# Patient Record
Sex: Male | Born: 1995 | Race: Black or African American | Hispanic: No | Marital: Single | Smoking: Current every day smoker
Health system: Southern US, Community
[De-identification: ages and names within clinical notes are randomized; demographics above are authoritative.]

---
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
## 2015-02-19 ENCOUNTER — Encounter (HOSPITAL_COMMUNITY): Payer: Self-pay | Admitting: Emergency Medicine

## 2015-02-19 ENCOUNTER — Emergency Department (HOSPITAL_COMMUNITY)
Admission: EM | Admit: 2015-02-19 | Discharge: 2015-02-19 | Disposition: A | Discharge status: Home / Self Care | Payer: Self-pay | Attending: Emergency Medicine | Admitting: Emergency Medicine

## 2015-02-19 DIAGNOSIS — X58XXXS Exposure to other specified factors, sequela: Secondary | ICD-10-CM | POA: Insufficient documentation

## 2015-02-19 DIAGNOSIS — R519 Headache, unspecified: Secondary | ICD-10-CM

## 2015-02-19 DIAGNOSIS — R300 Dysuria: Secondary | ICD-10-CM | POA: Insufficient documentation

## 2015-02-19 DIAGNOSIS — S61302S Unspecified open wound of right middle finger with damage to nail, sequela: Secondary | ICD-10-CM | POA: Insufficient documentation

## 2015-02-19 DIAGNOSIS — S61309S Unspecified open wound of unspecified finger with damage to nail, sequela: Secondary | ICD-10-CM

## 2015-02-19 DIAGNOSIS — R51 Headache: Secondary | ICD-10-CM | POA: Insufficient documentation

## 2015-02-19 DIAGNOSIS — A64 Unspecified sexually transmitted disease: Secondary | ICD-10-CM | POA: Insufficient documentation

## 2015-02-19 LAB — URINALYSIS, ROUTINE W REFLEX MICROSCOPIC
Glucose, UA: NEGATIVE mg/dL
HGB URINE DIPSTICK: NEGATIVE
Ketones, ur: 15 mg/dL — AB
NITRITE: NEGATIVE
Protein, ur: NEGATIVE mg/dL
Specific Gravity, Urine: 1.03 (ref 1.005–1.030)
UROBILINOGEN UA: 1 mg/dL (ref 0.0–1.0)
pH: 5.5 (ref 5.0–8.0)

## 2015-02-19 LAB — URINE MICROSCOPIC-ADD ON

## 2015-02-19 MED ORDER — CEFTRIAXONE SODIUM 250 MG IJ SOLR
250.0000 mg | Freq: Once | INTRAMUSCULAR | Status: AC
  Administered 2015-02-19: 250 mg via INTRAMUSCULAR
  Filled 2015-02-19: qty 250

## 2015-02-19 MED ORDER — AZITHROMYCIN 250 MG PO TABS
1000.0000 mg | ORAL_TABLET | Freq: Once | ORAL | Status: AC
  Administered 2015-02-19: 1000 mg via ORAL
  Filled 2015-02-19: qty 4

## 2015-02-19 MED ORDER — ACETAMINOPHEN 325 MG PO TABS
650.0000 mg | ORAL_TABLET | Freq: Once | ORAL | Status: AC
  Administered 2015-02-19: 650 mg via ORAL
  Filled 2015-02-19: qty 2

## 2015-02-19 NOTE — ED Notes (Signed)
Pt verbalizes understanding of d/c instructions and denies any further needs at this time. 

## 2015-02-19 NOTE — ED Notes (Signed)
Pt. reports penile discharge onset today , denies hematuria or dysuria , pt. added right middle finger nail deformity and mild headache.

## 2015-02-19 NOTE — Discharge Instructions (Signed)
Return to the emergency room with worsening of symptoms, new symptoms or with symptoms that are concerning  °Abstain from intercourse until you have finished your antibiotic, you have no symptoms and partners have been treated. °Read below information and follow recommendations. ° °Safe Sex °Safe sex is about reducing the risk of giving or getting a sexually transmitted disease (STD). STDs are spread through sexual contact involving the genitals, mouth, or rectum. Some STDs can be cured and others cannot. Safe sex can also prevent unintended pregnancies.  °WHAT ARE SOME SAFE SEX PRACTICES? °· Limit your sexual activity to only one partner who is having sex with only you. °· Talk to your partner about his or her past partners, past STDs, and drug use. °· Use a condom every time you have sexual intercourse. This includes vaginal, oral, and anal sexual activity. Both females and males should wear condoms during oral sex. Only use latex or polyurethane condoms and water-based lubricants. Using petroleum-based lubricants or oils to lubricate a condom will weaken the condom and increase the chance that it will break. The condom should be in place from the beginning to the end of sexual activity. Wearing a condom reduces, but does not completely eliminate, your risk of getting or giving an STD. STDs can be spread by contact with infected body fluids and skin. °· Get vaccinated for hepatitis B and HPV. °· Avoid alcohol and recreational drugs, which can affect your judgment. You may forget to use a condom or participate in high-risk sex. °· For females, avoid douching after sexual intercourse. Douching can spread an infection farther into the reproductive tract. °· Check your body for signs of sores, blisters, rashes, or unusual discharge. See your health care provider if you notice any of these signs. °· Avoid sexual contact if you have symptoms of an infection or are being treated for an STD. If you or your partner has  herpes, avoid sexual contact when blisters are present. Use condoms at all other times. °· If you are at risk of being infected with HIV, it is recommended that you take a prescription medicine daily to prevent HIV infection. This is called pre-exposure prophylaxis (PrEP). You are considered at risk if: °¨ You are a man who has sex with other men (MSM). °¨ You are a heterosexual man or woman who is sexually active with more than one partner. °¨ You take drugs by injection. °¨ You are sexually active with a partner who has HIV. °· Talk with your health care provider about whether you are at high risk of being infected with HIV. If you choose to begin PrEP, you should first be tested for HIV. You should then be tested every 3 months for as long as you are taking PrEP. °· See your health care provider for regular screenings, exams, and tests for other STDs. Before having sex with a new partner, each of you should be screened for STDs and should talk about the results with each other. °WHAT ARE THE BENEFITS OF SAFE SEX?  °· There is less chance of getting or giving an STD. °· You can prevent unwanted or unintended pregnancies. °· By discussing safe sex concerns with your partner, you may increase feelings of intimacy, comfort, trust, and honesty between the two of you. °Document Released: 09/13/2004 Document Revised: 12/21/2013 Document Reviewed: 01/28/2012 °ExitCare® Patient Information ©2015 [employer]. This information is not intended to replace advice given to you by your health care provider. Make sure you discuss any questions   you have with your health care provider. ° °Sexually Transmitted Disease °A sexually transmitted disease (STD) is a disease or infection that may be passed (transmitted) from person to person, usually during sexual activity. This may happen by way of saliva, semen, blood, vaginal mucus, or urine. Common STDs include:  °· Gonorrhea.   °· Chlamydia.   °· Syphilis.   °· HIV and AIDS.    °· Genital herpes.   °· Hepatitis B and C.   °· Trichomonas.   °· Human papillomavirus (HPV).   °· Pubic lice.   °· Scabies. °· Mites. °· Bacterial vaginosis. °WHAT ARE CAUSES OF STDs? °An STD may be caused by bacteria, a virus, or parasites. STDs are often transmitted during sexual activity if one person is infected. However, they may also be transmitted through nonsexual means. STDs may be transmitted after:  °· Sexual intercourse with an infected person.   °· Sharing sex toys with an infected person.   °· Sharing needles with an infected person or using unclean piercing or tattoo needles. °· Having intimate contact with the genitals, mouth, or rectal areas of an infected person.   °· Exposure to infected fluids during birth. °WHAT ARE THE SIGNS AND SYMPTOMS OF STDs? °Different STDs have different symptoms. Some people may not have any symptoms. If symptoms are present, they may include:  °· Painful or bloody urination.   °· Pain in the pelvis, abdomen, vagina, anus, throat, or eyes.   °· A skin rash, itching, or irritation. °· Growths, ulcerations, blisters, or sores in the genital and anal areas. °· Abnormal vaginal discharge with or without bad odor.   °· Penile discharge in men.   °· Fever.   °· Pain or bleeding during sexual intercourse.   °· Swollen glands in the groin area.   °· Yellow skin and eyes (jaundice). This is seen with hepatitis.   °· Swollen testicles. °· Infertility. °· Sores and blisters in the mouth. °HOW ARE STDs DIAGNOSED? °To make a diagnosis, your health care provider may:  °· Take a medical history.   °· Perform a physical exam.   °· Take a sample of any discharge to examine. °· Swab the throat, cervix, opening to the penis, rectum, or vagina for testing. °· Test a sample of your first morning urine.   °· Perform blood tests.   °· Perform a Pap test, if this applies.   °· Perform a colposcopy.   °· Perform a laparoscopy.   °HOW ARE STDs TREATED? ° Treatment depends on the STD. Some STDs  may be treated but not cured.  °· Chlamydia, gonorrhea, trichomonas, and syphilis can be cured with antibiotic medicine.   °· Genital herpes, hepatitis, and HIV can be treated, but not cured, with prescribed medicines. The medicines lessen symptoms.   °· Genital warts from HPV can be treated with medicine or by freezing, burning (electrocautery), or surgery. Warts may come back.   °· HPV cannot be cured with medicine or surgery. However, abnormal areas may be removed from the cervix, vagina, or vulva.   °· If your diagnosis is confirmed, your recent sexual partners need treatment. This is true even if they are symptom-free or have a negative culture or evaluation. They should not have sex until their health care providers say it is okay. °HOW CAN I REDUCE MY RISK OF GETTING AN STD? °Take these steps to reduce your risk of getting an STD: °· Use latex condoms, dental dams, and water-soluble lubricants during sexual activity. Do not use petroleum jelly or oils. °· Avoid having multiple sex partners. °· Do not have sex with someone who has other sex partners. °· Do not have sex with   anyone you do not know or who is at high risk for an STD. °· Avoid risky sex practices that can break your skin. °· Do not have sex if you have open sores on your mouth or skin. °· Avoid drinking too much alcohol or taking illegal drugs. Alcohol and drugs can affect your judgment and put you in a vulnerable position. °· Avoid engaging in oral and anal sex acts. °· Get vaccinated for HPV and hepatitis. If you have not received these vaccines in the past, talk to your health care provider about whether one or both might be right for you.   °· If you are at risk of being infected with HIV, it is recommended that you take a prescription medicine daily to prevent HIV infection. This is called pre-exposure prophylaxis (PrEP). You are considered at risk if: °¨ You are a man who has sex with other men (MSM). °¨ You are a heterosexual man or woman  and are sexually active with more than one partner. °¨ You take drugs by injection. °¨ You are sexually active with a partner who has HIV. °· Talk with your health care provider about whether you are at high risk of being infected with HIV. If you choose to begin PrEP, you should first be tested for HIV. You should then be tested every 3 months for as long as you are taking PrEP.   °WHAT SHOULD I DO IF I THINK I HAVE AN STD? °· See your health care provider.   °· Tell your sexual partner(s). They should be tested and treated for any STDs. °· Do not have sex until your health care provider says it is okay.  °WHEN SHOULD I GET IMMEDIATE MEDICAL CARE? °Contact your health care provider right away if:  °· You have severe abdominal pain. °· You are a man and notice swelling or pain in your testicles. °· You are a woman and notice swelling or pain in your vagina. °Document Released: 10/27/2002 Document Revised: 08/11/2013 Document Reviewed: 02/24/2013 °ExitCare® Patient Information ©2015 [employer]. This information is not intended to replace advice given to you by your health care provider. Make sure you discuss any questions you have with your health care provider. ° °

## 2015-02-19 NOTE — ED Provider Notes (Signed)
CSN: [id number]     Arrival date & time 02/19/15  1922 History  This chart was scribed for non-physician practitioner, Oswaldo Conroy, PA-C, working with Rolland Porter, MD, by Ronney Lion, ED Scribe. This patient was seen in room TR07C/TR07C and the patient's care was started at 8:50 PM.    Chief Complaint  Patient presents with  . Penile Discharge  . Finger Injury   The history is provided by the patient. No language interpreter was used.   HPI Comments: Bobby Byrd is a 19 y.o. male who presents to the Emergency Department complaining of constant green-ish penile discharge that he first noticed today. He endorses associated dysuria and reports cloudy urine. He states he has had unprotected sex and has concerns of STD's. He denies a history of any medical conditions. He also denies penile or testicular swelling, penile or testicular redness, hematuria, abdominal pain, fevers, or chills.   He also complains of a finger injury that occurred 1 month ago. He denies any pain or subsequent injury. He denies numbness, tingling, weakness, fever, chills, or swelling.     History reviewed. No pertinent past medical history. History reviewed. No pertinent past surgical history. No family history on file. History  Substance Use Topics  . Smoking status: Passive Smoke Exposure - Never Smoker  . Smokeless tobacco: Not on file  . Alcohol Use: No    Review of Systems  Constitutional: Negative for fever and chills.  Gastrointestinal: Negative for abdominal pain.  Genitourinary: Positive for dysuria and discharge. Negative for hematuria, penile swelling and scrotal swelling.  Neurological: Negative for weakness and numbness.    Allergies  Review of patient's allergies indicates no known allergies.  Home Medications   Prior to Admission medications   Medication Sig Start Date End Date Taking? Authorizing Provider  bismuth subsalicylate (PEPTO BISMOL) 262 MG/15ML suspension Take 30 mLs by  mouth every 6 (six) hours as needed.    Historical Provider, MD  ondansetron (ZOFRAN ODT) 4 MG disintegrating tablet  ODT q4 hours prn nausea/vomit 08/12/14   Mirian Mo, MD   BP 119/53 mmHg  Pulse 79  Temp(Src) 98.9 F (37.2 C) (Oral)  Resp 18  Wt 254 lb 9 oz (115.469 kg)  SpO2 100% Physical Exam  Constitutional: He appears well-developed and well-nourished. No distress.  HENT:  Head: Normocephalic and atraumatic.  Eyes: Conjunctivae and EOM are normal. Right eye exhibits no discharge. Left eye exhibits no discharge.  Cardiovascular: Normal rate and regular rhythm.   Pulses:      Radial pulses are 2+ on the right side, and 2+ on the left side.  Pulmonary/Chest: Effort normal and breath sounds normal. No respiratory distress. He has no wheezes.  Abdominal: Soft. Bowel sounds are normal. He exhibits no distension. There is no tenderness.  Genitourinary:  Normal appearing uncircumcised penis without erythema, lesions, swelling. Opaque yellow penile discharge. Scrotum without edema, erythema or lesions. Normal testicular lie. No tenderness to palpation. No inguinal hernias. Scribe as Biomedical engineer.  Musculoskeletal:  Partial nail avulsion to right middle finger. 5/5 strength. Sensaion intact. 2+ radial pulses. FROM.   Neurological: He is alert. He exhibits normal muscle tone. Coordination normal.  Speech fluent and goal oriented. Patient moves all four extremities without any focal neurological deficits and has no facial droop. Normal gait.  Skin: Skin is warm and dry. He is not diaphoretic.  Nursing note and vitals reviewed.   ED Course  Procedures (including critical care time)  DIAGNOSTIC STUDIES: Oxygen Saturation is  98% on RA, normal by my interpretation.    COORDINATION OF CARE: 8:52 PM - Discussed treatment plan with pt at bedside which includes prophylactic treatment for gonorrhea and chlamydia. STD test results available by phone. Advised abstention from sexual activity  for a period of time until partners treated and completed all antibiotics. Discussed safe sex. Pt with remote nail avulsion. No infection. Follow up as needed.  Labs Review Labs Reviewed  URINALYSIS, ROUTINE W REFLEX MICROSCOPIC (NOT AT [facility]) - Abnormal; Notable for the following:    Color, Urine AMBER (*)    APPearance CLOUDY (*)    Bilirubin Urine SMALL (*)    Ketones, ur 15 (*)    Leukocytes, UA LARGE (*)    All other components within normal limits  URINE MICROSCOPIC-ADD ON - Abnormal; Notable for the following:    Bacteria, UA FEW (*)    All other components within normal limits  RPR  HIV ANTIBODY (ROUTINE TESTING)  GC/CHLAMYDIA PROBE AMP ([place]) NOT [facility]    Meds given in ED:  Medications  cefTRIAXone (ROCEPHIN) injection 250 mg (250 mg Intramuscular Given 02/19/15 2124)  azithromycin (ZITHROMAX) tablet 1,000 mg (1,000 mg Oral Given 02/19/15 2123)  acetaminophen (TYLENOL) tablet 650 mg (650 mg Oral Given 02/19/15 2123)    Discharge Medication List as of 02/19/2015  9:37 PM        MDM   Final diagnoses:  STD (male)  Nonintractable headache  Nail avulsion, finger, sequela   It has been determined that no acute conditions requiring further emergency intervention are present at this time. The patient/guardian have been advised of the diagnosis and plan. We have discussed signs and symptoms that warrant return to the ED, such as changes or worsening in symptoms.  I personally performed the services described in this documentation, which was scribed in my presence. The recorded information has been reviewed and is accurate.    Oswaldo ConroyVictoria Edson Deridder, PA-C 02/20/15 2237  Rolland PorterMark James, MD 02/24/15 [phone]

## 2015-02-20 LAB — RPR: RPR Ser Ql: NONREACTIVE

## 2015-02-20 LAB — HIV ANTIBODY (ROUTINE TESTING W REFLEX): HIV Screen 4th Generation wRfx: NONREACTIVE

## 2015-02-22 LAB — GC/CHLAMYDIA PROBE AMP (~~LOC~~) NOT AT ARMC
Chlamydia: NEGATIVE
Neisseria Gonorrhea: POSITIVE — AB

## 2015-02-23 ENCOUNTER — Telehealth (HOSPITAL_BASED_OUTPATIENT_CLINIC_OR_DEPARTMENT_OTHER): Payer: Self-pay | Admitting: Emergency Medicine

## 2015-05-22 ENCOUNTER — Encounter (HOSPITAL_COMMUNITY): Payer: Self-pay | Admitting: Emergency Medicine

## 2015-05-22 ENCOUNTER — Emergency Department (HOSPITAL_COMMUNITY)
Admission: EM | Admit: 2015-05-22 | Discharge: 2015-05-22 | Disposition: A | Discharge status: Home / Self Care | Payer: Self-pay | Attending: Emergency Medicine | Admitting: Emergency Medicine

## 2015-05-22 DIAGNOSIS — R300 Dysuria: Secondary | ICD-10-CM | POA: Insufficient documentation

## 2015-05-22 DIAGNOSIS — Z202 Contact with and (suspected) exposure to infections with a predominantly sexual mode of transmission: Secondary | ICD-10-CM | POA: Insufficient documentation

## 2015-05-22 DIAGNOSIS — M25561 Pain in right knee: Secondary | ICD-10-CM | POA: Insufficient documentation

## 2015-05-22 DIAGNOSIS — R369 Urethral discharge, unspecified: Secondary | ICD-10-CM | POA: Insufficient documentation

## 2015-05-22 DIAGNOSIS — Z711 Person with feared health complaint in whom no diagnosis is made: Secondary | ICD-10-CM

## 2015-05-22 MED ORDER — NAPROXEN 250 MG PO TABS: 250.0000 mg | ORAL_TABLET | Freq: Two times a day (BID) | ORAL | Status: DC

## 2015-05-22 MED ORDER — LIDOCAINE HCL (PF) 1 % IJ SOLN
INTRAMUSCULAR | Status: AC
  Administered 2015-05-22: 2 mL
  Filled 2015-05-22: qty 5

## 2015-05-22 MED ORDER — PENICILLIN G BENZATHINE 1200000 UNIT/2ML IM SUSP
2.4000 10*6.[IU] | Freq: Once | INTRAMUSCULAR | Status: AC
  Administered 2015-05-22: 2.4 10*6.[IU] via INTRAMUSCULAR
  Filled 2015-05-22: qty 4

## 2015-05-22 MED ORDER — AZITHROMYCIN 250 MG PO TABS
1000.0000 mg | ORAL_TABLET | Freq: Once | ORAL | Status: AC
  Administered 2015-05-22: 1000 mg via ORAL
  Filled 2015-05-22: qty 4

## 2015-05-22 MED ORDER — CEFTRIAXONE SODIUM 250 MG IJ SOLR
250.0000 mg | Freq: Once | INTRAMUSCULAR | Status: AC
  Administered 2015-05-22: 250 mg via INTRAMUSCULAR
  Filled 2015-05-22: qty 250

## 2015-05-22 MED ORDER — LIDOCAINE HCL 2 % IJ SOLN
5.0000 mL | Freq: Once | INTRAMUSCULAR | Status: DC
  Filled 2015-05-22: qty 20

## 2015-05-22 NOTE — Discharge Instructions (Signed)
Please follow up next week at the health department to have repeat STD testing to ensure resolution of your STDs. Please notify all partners and their need for treatment and testing.  Sexually Transmitted Disease A sexually transmitted disease (STD) is a disease or infection that may be passed (transmitted) from person to person, usually during sexual activity. This may happen by way of saliva, semen, blood, vaginal mucus, or urine. Common STDs include:   Gonorrhea.   Chlamydia.   Syphilis.   HIV and AIDS.   Genital herpes.   Hepatitis B and C.   Trichomonas.   Human papillomavirus (HPV).   Pubic lice.   Scabies.  Mites.  Bacterial vaginosis. WHAT ARE CAUSES OF STDs? An STD may be caused by bacteria, a virus, or parasites. STDs are often transmitted during sexual activity if one person is infected. However, they may also be transmitted through nonsexual means. STDs may be transmitted after:   Sexual intercourse with an infected person.   Sharing sex toys with an infected person.   Sharing needles with an infected person or using unclean piercing or tattoo needles.  Having intimate contact with the genitals, mouth, or rectal areas of an infected person.   Exposure to infected fluids during birth. WHAT ARE THE SIGNS AND SYMPTOMS OF STDs? Different STDs have different symptoms. Some people may not have any symptoms. If symptoms are present, they may include:   Painful or bloody urination.   Pain in the pelvis, abdomen, vagina, anus, throat, or eyes.   A skin rash, itching, or irritation.  Growths, ulcerations, blisters, or sores in the genital and anal areas.  Abnormal vaginal discharge with or without bad odor.   Penile discharge in men.   Fever.   Pain or bleeding during sexual intercourse.   Swollen glands in the groin area.   Yellow skin and eyes (jaundice). This is seen with hepatitis.   Swollen testicles.  Infertility.  Sores  and blisters in the mouth. HOW ARE STDs DIAGNOSED? To make a diagnosis, your health care provider may:   Take a medical history.   Perform a physical exam.   Take a sample of any discharge to examine.  Swab the throat, cervix, opening to the penis, rectum, or vagina for testing.  Test a sample of your first morning urine.   Perform blood tests.   Perform a Pap test, if this applies.   Perform a colposcopy.   Perform a laparoscopy.  HOW ARE STDs TREATED? Treatment depends on the STD. Some STDs may be treated but not cured.   Chlamydia, gonorrhea, trichomonas, and syphilis can be cured with antibiotic medicine.   Genital herpes, hepatitis, and HIV can be treated, but not cured, with prescribed medicines. The medicines lessen symptoms.   Genital warts from HPV can be treated with medicine or by freezing, burning (electrocautery), or surgery. Warts may come back.   HPV cannot be cured with medicine or surgery. However, abnormal areas may be removed from the cervix, vagina, or vulva.   If your diagnosis is confirmed, your recent sexual partners need treatment. This is true even if they are symptom-free or have a negative culture or evaluation. They should not have sex until their health care providers say it is okay. HOW CAN I REDUCE MY RISK OF GETTING AN STD? Take these steps to reduce your risk of getting an STD:  Use latex condoms, dental dams, and water-soluble lubricants during sexual activity. Do not use petroleum jelly or oils.  Avoid having multiple sex partners.  Do not have sex with someone who has other sex partners.  Do not have sex with anyone you do not know or who is at high risk for an STD.  Avoid risky sex practices that can break your skin.  Do not have sex if you have open sores on your mouth or skin.  Avoid drinking too much alcohol or taking illegal drugs. Alcohol and drugs can affect your judgment and put you in a vulnerable  position.  Avoid engaging in oral and anal sex acts.  Get vaccinated for HPV and hepatitis. If you have not received these vaccines in the past, talk to your health care provider about whether one or both might be right for you.   If you are at risk of being infected with HIV, it is recommended that you take a prescription medicine daily to prevent HIV infection. This is called pre-exposure prophylaxis (PrEP). You are considered at risk if:  You are a man who has sex with other men (MSM).  You are a heterosexual man or woman and are sexually active with more than one partner.  You take drugs by injection.  You are sexually active with a partner who has HIV.  Talk with your health care provider about whether you are at high risk of being infected with HIV. If you choose to begin PrEP, you should first be tested for HIV. You should then be tested every 3 months for as long as you are taking PrEP.  WHAT SHOULD I DO IF I THINK I HAVE AN STD?  See your health care provider.   Tell your sexual partner(s). They should be tested and treated for any STDs.  Do not have sex until your health care provider says it is okay. WHEN SHOULD I GET IMMEDIATE MEDICAL CARE? Contact your health care provider right away if:   You have severe abdominal pain.  You are a man and notice swelling or pain in your testicles.  You are a woman and notice swelling or pain in your vagina. Document Released: 10/27/2002 Document Revised: 08/11/2013 Document Reviewed: 02/24/2013 [facility] Patient Information 2015 [place], [place]. This information is not intended to replace advice given to you by your health care provider. Make sure you discuss any questions you have with your health care provider. Knee Pain The knee is the complex joint between your thigh and your lower leg. It is made up of bones, tendons, ligaments, and cartilage. The bones that make up the knee are:  The femur in the thigh.  The tibia and  fibula in the lower leg.  The patella or kneecap riding in the groove on the lower femur. CAUSES  Knee pain is a common complaint with many causes. A few of these causes are:  Injury, such as:  A ruptured ligament or tendon injury.  Torn cartilage.  Medical conditions, such as:  Gout  Arthritis  Infections  Overuse, over training, or overdoing a physical activity. Knee pain can be minor or severe. Knee pain can accompany debilitating injury. Minor knee problems often respond well to self-care measures or get well on their own. More serious injuries may need medical intervention or even surgery. SYMPTOMS The knee is complex. Symptoms of knee problems can vary widely. Some of the problems are:  Pain with movement and weight bearing.  Swelling and tenderness.  Buckling of the knee.  Inability to straighten or extend your knee.  Your knee locks and you cannot straighten  it.  Warmth and redness with pain and fever.  Deformity or dislocation of the kneecap. DIAGNOSIS  Determining what is wrong may be very straight forward such as when there is an injury. It can also be challenging because of the complexity of the knee. Tests to make a diagnosis may include:  Your caregiver taking a history and doing a physical exam.  Routine X-rays can be used to rule out other problems. X-rays will not reveal a cartilage tear. Some injuries of the knee can be diagnosed by:  Arthroscopy a surgical technique by which a small video camera is inserted through tiny incisions on the sides of the knee. This procedure is used to examine and repair internal knee joint problems. Tiny instruments can be used during arthroscopy to repair the torn knee cartilage (meniscus).  Arthrography is a radiology technique. A contrast liquid is directly injected into the knee joint. Internal structures of the knee joint then become visible on X-ray film.  An MRI scan is a non X-ray radiology procedure in which  magnetic fields and a computer produce two- or three-dimensional images of the inside of the knee. Cartilage tears are often visible using an MRI scanner. MRI scans have largely replaced arthrography in diagnosing cartilage tears of the knee.  Blood work.  Examination of the fluid that helps to lubricate the knee joint (synovial fluid). This is done by taking a sample out using a needle and a syringe. TREATMENT The treatment of knee problems depends on the cause. Some of these treatments are:  Depending on the injury, proper casting, splinting, surgery, or physical therapy care will be needed.  Give yourself adequate recovery time. Do not overuse your joints. If you begin to get sore during workout routines, back off. Slow down or do fewer repetitions.  For repetitive activities such as cycling or running, maintain your strength and nutrition.  Alternate muscle groups. For example, if you are a weight lifter, work the upper body on one day and the lower body the next.  Either tight or weak muscles do not give the proper support for your knee. Tight or weak muscles do not absorb the stress placed on the knee joint. Keep the muscles surrounding the knee strong.  Take care of mechanical problems.  If you have flat feet, orthotics or special shoes may help. See your caregiver if you need help.  Arch supports, sometimes with wedges on the inner or outer aspect of the heel, can help. These can shift pressure away from the side of the knee most bothered by osteoarthritis.  A brace called an "unloader" brace also may be used to help ease the pressure on the most arthritic side of the knee.  If your caregiver has prescribed crutches, braces, wraps or ice, use as directed. The acronym for this is PRICE. This means protection, rest, ice, compression, and elevation.  Nonsteroidal anti-inflammatory drugs (NSAIDs), can help relieve pain. But if taken immediately after an injury, they may actually  increase swelling. Take NSAIDs with food in your stomach. Stop them if you develop stomach problems. Do not take these if you have a history of ulcers, stomach pain, or bleeding from the bowel. Do not take without your caregiver's approval if you have problems with fluid retention, heart failure, or kidney problems.  For ongoing knee problems, physical therapy may be helpful.  Glucosamine and chondroitin are over-the-counter dietary supplements. Both may help relieve the pain of osteoarthritis in the knee. These medicines are different from the  usual anti-inflammatory drugs. Glucosamine may decrease the rate of cartilage destruction.  Injections of a corticosteroid drug into your knee joint may help reduce the symptoms of an arthritis flare-up. They may provide pain relief that lasts a few months. You may have to wait a few months between injections. The injections do have a small increased risk of infection, water retention, and elevated blood sugar levels.  Hyaluronic acid injected into damaged joints may ease pain and provide lubrication. These injections may work by reducing inflammation. A series of shots may give relief for as long as 6 months.  Topical painkillers. Applying certain ointments to your skin may help relieve the pain and stiffness of osteoarthritis. Ask your pharmacist for suggestions. Many over the-counter products are approved for temporary relief of arthritis pain.  In some countries, doctors often prescribe topical NSAIDs for relief of chronic conditions such as arthritis and tendinitis. A review of treatment with NSAID creams found that they worked as well as oral medications but without the serious side effects. PREVENTION  Maintain a healthy weight. Extra pounds put more strain on your joints.  Get strong, stay limber. Weak muscles are a common cause of knee injuries. Stretching is important. Include flexibility exercises in your workouts.  Be smart about exercise. If  you have osteoarthritis, chronic knee pain or recurring injuries, you may need to change the way you exercise. This does not mean you have to stop being active. If your knees ache after jogging or playing basketball, consider switching to swimming, water aerobics, or other low-impact activities, at least for a few days a week. Sometimes limiting high-impact activities will provide relief.  Make sure your shoes fit well. Choose footwear that is right for your sport.  Protect your knees. Use the proper gear for knee-sensitive activities. Use kneepads when playing volleyball or laying carpet. Buckle your seat belt every time you drive. Most shattered kneecaps occur in car accidents.  Rest when you are tired. SEEK MEDICAL CARE IF:  You have knee pain that is continual and does not seem to be getting better.  SEEK IMMEDIATE MEDICAL CARE IF:  Your knee joint feels hot to the touch and you have a high fever. MAKE SURE YOU:   Understand these instructions.  Will watch your condition.  Will get help right away if you are not doing well or get worse. Document Released: 06/03/2007 Document Revised: 10/29/2011 Document Reviewed: 06/03/2007 [facility] Patient Information 2015 [place], [place]. This information is not intended to replace advice given to you by your health care provider. Make sure you discuss any questions you have with your health care provider.

## 2015-05-22 NOTE — ED Provider Notes (Signed)
CSN: [id number]     Arrival date & time 05/22/15  1021 History   First MD Initiated Contact with Patient 05/22/15 1103     Chief Complaint  Patient presents with  . Knee Pain  . Penile Discharge    Bobby Byrd is a 19 y.o. male with a history of multiple coronary and Chlamydia infection who presents to the emergency department complaining of penile discharge for the past 2 days. He also reports associated dysuria. Pupils is feels similar to last and he is diagnosed with gonorrhea and chlamydia. Patient reports he is having unprotected sex with one male partner. He reports this is the same partner he has had frequent STD infections from. Patient also complains of right knee pain ongoing for the past year. He denies current pain, but reports he has pain after heavy work out or after playing basketball for a long period of time. He denies any current pain. The patient denies fevers, chills, abdominal pain, nausea, vomiting, diarrhea, hematuria, hematochezia, penile lesions, penile rashes, testicular pain, penile pain, mouth sores or knee pain or leg swelling.   (Consider location/radiation/quality/duration/timing/severity/associated sxs/prior Treatment) HPI  History reviewed. No pertinent past medical history. History reviewed. No pertinent past surgical history. History reviewed. No pertinent family history. Social History  Substance Use Topics  . Smoking status: Passive Smoke Exposure - Never Smoker  . Smokeless tobacco: None  . Alcohol Use: No    Review of Systems  Constitutional: Negative for fever and chills.  HENT: Negative for mouth sores and sore throat.   Eyes: Negative for visual disturbance.  Respiratory: Negative for cough.   Cardiovascular: Negative for chest pain and leg swelling.  Gastrointestinal: Negative for nausea, vomiting, abdominal pain and diarrhea.  Genitourinary: Positive for dysuria and discharge. Negative for urgency, frequency, hematuria, flank  pain, decreased urine volume, penile swelling, scrotal swelling, difficulty urinating, genital sores, penile pain and testicular pain.  Musculoskeletal: Positive for arthralgias. Negative for back pain and neck pain.  Skin: Negative for rash.  Neurological: Negative for headaches.      Allergies  Review of patient's allergies indicates no known allergies.  Home Medications   Prior to Admission medications   Medication Sig Start Date End Date Taking? Authorizing Provider  bismuth subsalicylate (PEPTO BISMOL) 262 MG/15ML suspension Take 30 mLs by mouth every 6 (six) hours as needed.    Historical Provider, MD  naproxen (NAPROSYN) 250 MG tablet Take 1 tablet (250 mg total) by mouth 2 (two) times daily with a meal. 05/22/15   Everlene Farrier, PA-C  ondansetron (ZOFRAN ODT) 4 MG disintegrating tablet  ODT q4 hours prn nausea/vomit 08/12/14   Mirian Mo, MD   BP 122/68 mmHg  Pulse 64  Temp(Src) 98.2 F (36.8 C) (Oral)  Resp 20  Wt 251 lb 4 oz (113.966 kg)  SpO2 100% Physical Exam  Constitutional: He is oriented to person, place, and time. He appears well-developed and well-nourished. No distress.  Nontoxic appearing.  HENT:  Head: Normocephalic and atraumatic.  Mouth/Throat: Oropharynx is clear and moist. No oropharyngeal exudate.  Eyes: Conjunctivae are normal. Pupils are equal, round, and reactive to light. Right eye exhibits no discharge. Left eye exhibits no discharge.  Neck: Neck supple.  Cardiovascular: Normal rate, regular rhythm, normal heart sounds and intact distal pulses.   Bilateral radial and posterior tibialis pulses are intact.  Pulmonary/Chest: Effort normal and breath sounds normal. No respiratory distress. He has no wheezes. He has no rales.  Abdominal: Soft. Bowel sounds are  normal. He exhibits no distension. There is no tenderness. There is no guarding.  Abdomen is soft and nontender to palpation.  Musculoskeletal: Normal range of motion. He exhibits no edema  or tenderness.  Right knee is nontender to palpation. No right knee deformity, ecchymosis, edema or erythema. Good range of motion of his right knee. No right knee laxity noted. No calf edema or tenderness bilaterally. Patient is able to handle it with normal gait. Patient has 5/5 strength in his bilateral lower extremities.  Lymphadenopathy:    He has no cervical adenopathy.  Neurological: He is alert and oriented to person, place, and time. He has normal reflexes. He displays normal reflexes. Coordination normal.  Bilateral patellar DTRs are intact. Sensation is intact to his bilateral lower extremities. He is able to ambulate with normal gait.  Skin: Skin is warm and dry. No rash noted. He is not diaphoretic. No erythema. No pallor.  Psychiatric: He has a normal mood and affect. His behavior is normal.  Nursing note and vitals reviewed.   ED Course  Procedures (including critical care time) Labs Review Labs Reviewed  HIV ANTIBODY (ROUTINE TESTING)    Imaging Review No results found. I have personally reviewed and evaluated these images and lab results as part of my medical decision-making.   EKG Interpretation None      Filed Vitals:   05/22/15 1028 05/22/15 1032  BP:  122/68  Pulse:  64  Temp:  98.2 F (36.8 C)  TempSrc: Oral Oral  Resp:  20  Weight:  251 lb 4 oz (113.966 kg)  SpO2:  100%     MDM   Meds given in ED:  Medications  penicillin g benzathine (BICILLIN LA) 1200000 UNIT/2ML injection 2.4 Million Units (not administered)  azithromycin (ZITHROMAX) tablet 1,000 mg (not administered)  cefTRIAXone (ROCEPHIN) injection 250 mg (not administered)    New Prescriptions   NAPROXEN (NAPROSYN) 250 MG TABLET    Take 1 tablet (250 mg total) by mouth 2 (two) times daily with a meal.    Final diagnoses:  Concern about STD in male without diagnosis  Right knee pain   This is a 19 y.o. male with a history of multiple coronary and Chlamydia infection who presents to  the emergency department complaining of penile discharge for the past 2 days. He also reports associated dysuria. Pupils is feels similar to last and he is diagnosed with gonorrhea and chlamydia. Patient reports he is having unprotected sex with one male partner. Patient also complains of right knee pain ongoing for the past year that is worse after heavy work out or playing basketball. The patient denies current knee pain. On exam patient is afebrile and nontoxic appearing. His abdomen is soft and nontender to palpation. He has no right knee edema, deformity, ecchymosis or erythema. He has good range of motion of his right knee is able to ambulate with normal gait. This patient has had frequent infections with gonorrhea and chlamydia in the past year, after discussing with my attending, was decided we would go ahead and treat the patient with Rocephin, azithromycin and penicillin to cover syphilis, gonorrhea and chlamydia. I will check the patient for HIV and I notified him that this test will be pending at time of discharge and he should follow-up with this test.  I also advised the patient to follow-up next week at the [facility] to have repeat STD testing. I also advised the patient needs to notify all partners to have  STD testing and treatment. I also advised the patient of stated sex practices and encouraged him to always use a condom when having sex.  I advised the patient to follow-up with their primary care provider this week. I advised the patient to return to the emergency department with new or worsening symptoms or new concerns. The patient verbalized understanding and agreement with plan.      Everlene Farrier, PA-C 05/22/15 1128  Eber Hong, MD 05/24/15 [phone]

## 2015-05-22 NOTE — ED Notes (Signed)
PT applied own knee sleeve to right knee. Pt states sleeve is comfortable.

## 2015-05-22 NOTE — ED Notes (Signed)
Pt sts right knee pain x months when playing sports; pt also c/o penile discharge after having unprotected intercourse

## 2015-05-24 LAB — HIV ANTIBODY (ROUTINE TESTING W REFLEX): HIV SCREEN 4TH GENERATION: NONREACTIVE

## 2015-07-03 ENCOUNTER — Emergency Department (HOSPITAL_COMMUNITY): Payer: Medicaid Other

## 2015-07-03 ENCOUNTER — Encounter (HOSPITAL_COMMUNITY): Payer: Self-pay | Admitting: Emergency Medicine

## 2015-07-03 ENCOUNTER — Inpatient Hospital Stay (HOSPITAL_COMMUNITY)
Admission: EM | Admit: 2015-07-03 | Discharge: 2015-07-12 | DRG: 200 | Disposition: A | Discharge status: Home / Self Care | Payer: Medicaid Other | Attending: General Surgery | Admitting: General Surgery

## 2015-07-03 DIAGNOSIS — W3400XA Accidental discharge from unspecified firearms or gun, initial encounter: Secondary | ICD-10-CM

## 2015-07-03 DIAGNOSIS — R0602 Shortness of breath: Secondary | ICD-10-CM

## 2015-07-03 DIAGNOSIS — S21142A Puncture wound with foreign body of left front wall of thorax without penetration into thoracic cavity, initial encounter: Secondary | ICD-10-CM | POA: Diagnosis present

## 2015-07-03 DIAGNOSIS — S2242XA Multiple fractures of ribs, left side, initial encounter for closed fracture: Secondary | ICD-10-CM | POA: Diagnosis present

## 2015-07-03 DIAGNOSIS — S21332A Puncture wound without foreign body of left front wall of thorax with penetration into thoracic cavity, initial encounter: Secondary | ICD-10-CM

## 2015-07-03 DIAGNOSIS — S21139A Puncture wound without foreign body of unspecified front wall of thorax without penetration into thoracic cavity, initial encounter: Secondary | ICD-10-CM

## 2015-07-03 DIAGNOSIS — J942 Hemothorax: Secondary | ICD-10-CM | POA: Diagnosis present

## 2015-07-03 DIAGNOSIS — D62 Acute posthemorrhagic anemia: Secondary | ICD-10-CM | POA: Diagnosis not present

## 2015-07-03 DIAGNOSIS — S272XXA Traumatic hemopneumothorax, initial encounter: Secondary | ICD-10-CM

## 2015-07-03 DIAGNOSIS — T148 Other injury of unspecified body region: Secondary | ICD-10-CM | POA: Diagnosis present

## 2015-07-03 DIAGNOSIS — J939 Pneumothorax, unspecified: Secondary | ICD-10-CM

## 2015-07-03 LAB — TYPE AND SCREEN
ABO/RH(D): A POS
Antibody Screen: NEGATIVE
Unit division: 0
Unit division: 0

## 2015-07-03 LAB — I-STAT CHEM 8, ED
BUN: 12 mg/dL (ref 6–20)
CALCIUM ION: 1.05 mmol/L — AB (ref 1.12–1.23)
CHLORIDE: 101 mmol/L (ref 101–111)
Creatinine, Ser: 1.1 mg/dL (ref 0.61–1.24)
GLUCOSE: 154 mg/dL — AB (ref 65–99)
HCT: 44 % (ref 39.0–52.0)
Hemoglobin: 15 g/dL (ref 13.0–17.0)
Potassium: 2.7 mmol/L — CL (ref 3.5–5.1)
Sodium: 140 mmol/L (ref 135–145)
TCO2: 23 mmol/L (ref 0–100)

## 2015-07-03 LAB — CBC
HCT: 40 % (ref 39.0–52.0)
Hemoglobin: 13 g/dL (ref 13.0–17.0)
MCH: 27.5 pg (ref 26.0–34.0)
MCHC: 32.5 g/dL (ref 30.0–36.0)
MCV: 84.7 fL (ref 78.0–100.0)
PLATELETS: 151 10*3/uL (ref 150–400)
RBC: 4.72 MIL/uL (ref 4.22–5.81)
RDW: 13.5 % (ref 11.5–15.5)
WBC: 9.6 10*3/uL (ref 4.0–10.5)

## 2015-07-03 LAB — PREPARE FRESH FROZEN PLASMA
Unit division: 0
Unit division: 0

## 2015-07-03 LAB — COMPREHENSIVE METABOLIC PANEL
ALK PHOS: 73 U/L (ref 38–126)
ALT: 19 U/L (ref 17–63)
AST: 25 U/L (ref 15–41)
Albumin: 3.7 g/dL (ref 3.5–5.0)
Anion gap: 11 (ref 5–15)
BILIRUBIN TOTAL: 0.3 mg/dL (ref 0.3–1.2)
BUN: 10 mg/dL (ref 6–20)
CALCIUM: 8.8 mg/dL — AB (ref 8.9–10.3)
CHLORIDE: 102 mmol/L (ref 101–111)
CO2: 24 mmol/L (ref 22–32)
CREATININE: 1.22 mg/dL (ref 0.61–1.24)
GFR calc Af Amer: >60 mL/min (ref >60)
Glucose, Bld: 155 mg/dL — ABNORMAL HIGH (ref 65–99)
Potassium: 2.8 mmol/L — ABNORMAL LOW (ref 3.5–5.1)
Sodium: 137 mmol/L (ref 135–145)
TOTAL PROTEIN: 6.9 g/dL (ref 6.5–8.1)

## 2015-07-03 LAB — ABO/RH: ABO/RH(D): A POS

## 2015-07-03 LAB — ETHANOL: Alcohol, Ethyl (B): <5 mg/dL (ref <5)

## 2015-07-03 LAB — CDS SEROLOGY

## 2015-07-03 LAB — MRSA PCR SCREENING: MRSA by PCR: NEGATIVE

## 2015-07-03 LAB — PROTIME-INR
INR: 1.08 (ref 0.00–1.49)
Prothrombin Time: 14.2 seconds (ref 11.6–15.2)

## 2015-07-03 MED ORDER — HYDROMORPHONE HCL 1 MG/ML IJ SOLN: 0.5000 mg | INTRAMUSCULAR | Status: DC | PRN

## 2015-07-03 MED ORDER — ONDANSETRON HCL 4 MG/2ML IJ SOLN: 4.0000 mg | Freq: Four times a day (QID) | INTRAMUSCULAR | Status: DC | PRN

## 2015-07-03 MED ORDER — ENOXAPARIN SODIUM 40 MG/0.4ML ~~LOC~~ SOLN
40.0000 mg | SUBCUTANEOUS | Status: DC
  Administered 2015-07-04: 40 mg via SUBCUTANEOUS
  Filled 2015-07-03 (×2): qty 0.4

## 2015-07-03 MED ORDER — OXYCODONE HCL 5 MG PO TABS
10.0000 mg | ORAL_TABLET | ORAL | Status: DC | PRN
  Administered 2015-07-03 – 2015-07-04 (×5): 10 mg via ORAL
  Filled 2015-07-03 (×5): qty 2

## 2015-07-03 MED ORDER — SODIUM CHLORIDE 0.9 % IV SOLN
INTRAVENOUS | Status: AC | PRN
  Administered 2015-07-03: 1000 mL via INTRAVENOUS

## 2015-07-03 MED ORDER — ONDANSETRON HCL 4 MG PO TABS: 4.0000 mg | ORAL_TABLET | Freq: Four times a day (QID) | ORAL | Status: DC | PRN

## 2015-07-03 MED ORDER — FENTANYL CITRATE (PF) 100 MCG/2ML IJ SOLN
INTRAMUSCULAR | Status: AC | PRN
  Administered 2015-07-03: 50 ug via INTRAVENOUS

## 2015-07-03 MED ORDER — KCL IN DEXTROSE-NACL 20-5-0.45 MEQ/L-%-% IV SOLN
INTRAVENOUS | Status: DC
  Administered 2015-07-03: 1000 mL via INTRAVENOUS
  Administered 2015-07-04: via INTRAVENOUS
  Filled 2015-07-03 (×4): qty 1000

## 2015-07-03 MED ORDER — FENTANYL CITRATE (PF) 100 MCG/2ML IJ SOLN
INTRAMUSCULAR | Status: AC
  Filled 2015-07-03: qty 2

## 2015-07-03 MED ORDER — LIDOCAINE-EPINEPHRINE (PF) 2 %-1:200000 IJ SOLN
INTRAMUSCULAR | Status: AC
  Filled 2015-07-03: qty 20

## 2015-07-03 MED ORDER — OXYCODONE HCL 5 MG PO TABS
5.0000 mg | ORAL_TABLET | ORAL | Status: DC | PRN
  Administered 2015-07-03 – 2015-07-04 (×2): 5 mg via ORAL
  Filled 2015-07-03 (×2): qty 1

## 2015-07-03 MED ORDER — IOHEXOL 300 MG/ML  SOLN
100.0000 mL | Freq: Once | INTRAMUSCULAR | Status: AC | PRN
  Administered 2015-07-03: 100 mL via INTRAVENOUS

## 2015-07-03 MED ORDER — PANTOPRAZOLE SODIUM 40 MG IV SOLR
40.0000 mg | Freq: Every day | INTRAVENOUS | Status: DC
  Administered 2015-07-03: 40 mg via INTRAVENOUS
  Filled 2015-07-03 (×3): qty 40

## 2015-07-03 MED ORDER — ACETAMINOPHEN 325 MG PO TABS: 650.0000 mg | ORAL_TABLET | ORAL | Status: DC | PRN

## 2015-07-03 MED ORDER — PANTOPRAZOLE SODIUM 40 MG PO TBEC
40.0000 mg | DELAYED_RELEASE_TABLET | Freq: Every day | ORAL | Status: DC
  Administered 2015-07-04: 40 mg via ORAL
  Filled 2015-07-03: qty 1

## 2015-07-03 NOTE — ED Notes (Signed)
Patient to CT with RN
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
## 2015-07-03 NOTE — ED Notes (Signed)
Pt called out requesting vaseline for his lips. This RN told the pt that the hospital does not carry vaseline, so this RN applied surgilube to the pt's lips.
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
## 2015-07-03 NOTE — ED Notes (Signed)
MD Janee Mornhompson attempting to place chest tube.

## 2015-07-03 NOTE — ED Notes (Signed)
Wallet and all contents conveyed to [employer]ious Taylor per patient request.
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
## 2015-07-03 NOTE — Progress Notes (Signed)
Airway in patent. Pt is alert and oriented.  Pt on NRB mask at this time. Saturations 97%. MD at bedside preparing for insertion of chest tube per hemothorax.

## 2015-07-03 NOTE — ED Notes (Signed)
Patient returned from CT

## 2015-07-03 NOTE — ED Notes (Signed)
NRB removed. Patient on RA currently.

## 2015-07-04 ENCOUNTER — Inpatient Hospital Stay (HOSPITAL_COMMUNITY): Payer: Medicaid Other

## 2015-07-04 LAB — BASIC METABOLIC PANEL
ANION GAP: 7 (ref 5–15)
BUN: 7 mg/dL (ref 6–20)
CALCIUM: 8.8 mg/dL — AB (ref 8.9–10.3)
CHLORIDE: 102 mmol/L (ref 101–111)
CO2: 26 mmol/L (ref 22–32)
Creatinine, Ser: 0.97 mg/dL (ref 0.61–1.24)
GFR calc non Af Amer: >60 mL/min (ref >60)
Glucose, Bld: 110 mg/dL — ABNORMAL HIGH (ref 65–99)
Potassium: 3.9 mmol/L (ref 3.5–5.1)
Sodium: 135 mmol/L (ref 135–145)

## 2015-07-04 LAB — BLOOD PRODUCT ORDER (VERBAL) VERIFICATION

## 2015-07-04 MED ORDER — SODIUM CHLORIDE 0.9 % IJ SOLN
3.0000 mL | Freq: Two times a day (BID) | INTRAMUSCULAR | Status: DC
  Administered 2015-07-04 – 2015-07-12 (×9): 3 mL via INTRAVENOUS

## 2015-07-04 MED ORDER — SODIUM CHLORIDE 0.9 % IJ SOLN
3.0000 mL | INTRAMUSCULAR | Status: DC | PRN
  Administered 2015-07-05: 3 mL via INTRAVENOUS
  Filled 2015-07-04: qty 3
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
## 2015-07-05 ENCOUNTER — Inpatient Hospital Stay (HOSPITAL_COMMUNITY): Payer: Medicaid Other

## 2015-07-05 DIAGNOSIS — W3400XA Accidental discharge from unspecified firearms or gun, initial encounter: Secondary | ICD-10-CM

## 2015-07-05 DIAGNOSIS — S2242XA Multiple fractures of ribs, left side, initial encounter for closed fracture: Secondary | ICD-10-CM | POA: Diagnosis present

## 2015-07-05 DIAGNOSIS — S21139A Puncture wound without foreign body of unspecified front wall of thorax without penetration into thoracic cavity, initial encounter: Secondary | ICD-10-CM

## 2015-07-05 MED ORDER — OXYCODONE HCL 5 MG PO TABS
5.0000 mg | ORAL_TABLET | ORAL | Status: DC | PRN
  Administered 2015-07-05: 5 mg via ORAL
  Administered 2015-07-05 – 2015-07-06 (×2): 15 mg via ORAL
  Administered 2015-07-07: 10 mg via ORAL
  Administered 2015-07-07 – 2015-07-09 (×3): 15 mg via ORAL
  Administered 2015-07-10: 10 mg via ORAL
  Administered 2015-07-11 – 2015-07-12 (×2): 15 mg via ORAL
  Filled 2015-07-05 (×3): qty 3
  Filled 2015-07-05: qty 1
  Filled 2015-07-05: qty 3
  Filled 2015-07-05 (×2): qty 2
  Filled 2015-07-05 (×2): qty 3
  Filled 2015-07-05: qty 1
  Filled 2015-07-05: qty 3
  Filled 2015-07-05: qty 1

## 2015-07-05 MED ORDER — DOCUSATE SODIUM 100 MG PO CAPS
100.0000 mg | ORAL_CAPSULE | Freq: Two times a day (BID) | ORAL | Status: DC
  Administered 2015-07-05 – 2015-07-10 (×7): 100 mg via ORAL
  Filled 2015-07-05 (×13): qty 1

## 2015-07-05 MED ORDER — HYDROMORPHONE HCL 1 MG/ML IJ SOLN: 0.5000 mg | INTRAMUSCULAR | Status: DC | PRN

## 2015-07-05 MED ORDER — POLYETHYLENE GLYCOL 3350 17 G PO PACK
17.0000 g | PACK | Freq: Every day | ORAL | Status: DC
  Administered 2015-07-06 – 2015-07-07 (×2): 17 g via ORAL
  Filled 2015-07-05 (×7): qty 1

## 2015-07-05 MED ORDER — ENOXAPARIN SODIUM 30 MG/0.3ML ~~LOC~~ SOLN
30.0000 mg | Freq: Two times a day (BID) | SUBCUTANEOUS | Status: DC
  Administered 2015-07-05 – 2015-07-12 (×15): 30 mg via SUBCUTANEOUS
  Filled 2015-07-05 (×15): qty 0.3
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
## 2015-07-06 ENCOUNTER — Inpatient Hospital Stay (HOSPITAL_COMMUNITY): Payer: Medicaid Other

## 2015-07-06 DIAGNOSIS — D62 Acute posthemorrhagic anemia: Secondary | ICD-10-CM | POA: Diagnosis not present

## 2015-07-06 LAB — CBC
HCT: 32.2 % — ABNORMAL LOW (ref 39.0–52.0)
Hemoglobin: 10.3 g/dL — ABNORMAL LOW (ref 13.0–17.0)
MCH: 27.1 pg (ref 26.0–34.0)
MCHC: 32 g/dL (ref 30.0–36.0)
MCV: 84.7 fL (ref 78.0–100.0)
PLATELETS: 161 10*3/uL (ref 150–400)
RBC: 3.8 MIL/uL — ABNORMAL LOW (ref 4.22–5.81)
RDW: 13.5 % (ref 11.5–15.5)
WBC: 7.7 10*3/uL (ref 4.0–10.5)
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
## 2015-07-06 NOTE — Progress Notes (Signed)
Patient complain of difficulty breathing. Sats checked 100% Room Air. Lungs clear. Dr. Lindie SpruceWyatt notified
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
## 2015-07-07 ENCOUNTER — Inpatient Hospital Stay (HOSPITAL_COMMUNITY): Payer: Medicaid Other
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
## 2015-07-08 ENCOUNTER — Inpatient Hospital Stay (HOSPITAL_COMMUNITY): Payer: Medicaid Other
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
## 2015-07-09 ENCOUNTER — Inpatient Hospital Stay (HOSPITAL_COMMUNITY): Payer: Medicaid Other
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
## 2015-07-10 ENCOUNTER — Inpatient Hospital Stay (HOSPITAL_COMMUNITY): Payer: Medicaid Other

## 2015-07-10 LAB — CREATININE, SERUM: CREATININE: 1.13 mg/dL (ref 0.61–1.24)
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
## 2015-07-11 ENCOUNTER — Inpatient Hospital Stay (HOSPITAL_COMMUNITY): Payer: Medicaid Other
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
## 2015-07-11 NOTE — Clinical Social Work Note (Signed)
Clinical Social Worker will sign off for now as social work intervention is no longer needed. Please consult us again if new need arises.  Jesse Kyriakos Babler, LCSW [phone]  

## 2015-07-12 ENCOUNTER — Inpatient Hospital Stay (HOSPITAL_COMMUNITY): Payer: Medicaid Other

## 2015-07-12 MED ORDER — OXYCODONE HCL 5 MG PO TABS: 5.0000 mg | ORAL_TABLET | Freq: Four times a day (QID) | ORAL | Status: AC | PRN

## 2015-07-12 NOTE — Discharge Instructions (Signed)
Chest Tube °A chest tube is a small, flexible drainage tube that is put into the chest. The tube drains fluid, blood, or extra air that has built up between the lungs and the inside of the chest wall (pleural space). Fluid or air can build up in this area for various reasons. When this occurs, it can prevent the lung from expanding completely and cause breathing problems. This can be dangerous. The chest tube allows the lung to re-expand. °The procedure to put in the chest tube involves inserting the tube through the skin between the ribs and into the pleural space. °LET YOUR HEALTH CARE PROVIDER KNOW ABOUT:  °· Any allergies you have. °· All medicines you are taking, including vitamins, herbs, eye drops, creams, and over-the-counter medicines. °· Previous problems you or members of your family have had with the use of anesthetics. °· Any blood disorders you have. °· Previous surgeries you have had. °· Medical conditions you have. °RISKS AND COMPLICATIONS °Generally, this is a safe procedure. However, as with any procedure, problems can occur. Possible problems include: °· Bleeding. °· Injury to the lung. °· Infection. °· Chest tube failing to work properly, usually due to leaking of air around the tube, or tube positioning in a place where all of the fluid or air cannot be drained. °· Problems related to the use of anesthetics or pain medicines. °BEFORE THE PROCEDURE  °Ask your health care provider if there are any special preparatory instructions such as not eating before the procedure. Follow these instructions exactly. °PROCEDURE  °The area where the chest tube will be inserted is numbed with a medicine (local anesthetic). You may be given medicine for pain and medicine to help you relax (sedative). An incision is made between the ribs, and a small opening is made through the inner lining of the chest wall. The chest tube is inserted through this opening and into the pleural space. The other end of the chest  tube may be connected to a plastic container that collects the fluid drained from the pleural space and has sterile water to make a one-way seal, or "water seal," that prevents air from going back into the pleural space. Suction is sometimes attached to the system for drainage. A stitch (suture) or tape is used to keep the tube in place.  °AFTER THE PROCEDURE  °A chest X-ray will be done to check the position of the chest tube. You will be monitored for breathing difficulties, air leaks in the chest tube, and the need for additional oxygen. You will be encouraged to breathe deeply. You may be given antibiotic medicine to prevent or treat infection. The chest tube will stay in place until all the extra air or fluid has drained from the chest. You will likely need to stay in the hospital until the chest tube is removed. In rare cases, you may go home with the chest tube in place. °  °This information is not intended to replace advice given to you by your health care provider. Make sure you discuss any questions you have with your health care provider. °  °Document Released: 11/14/2006 Document Revised: 08/11/2013 Document Reviewed: 02/11/2013 °Elsevier Interactive Patient Education ©2016 [employer] ° °
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
## 2015-07-12 NOTE — Progress Notes (Signed)
Patient discharged to home with instructions. 

## 2015-07-13 ENCOUNTER — Encounter (HOSPITAL_COMMUNITY): Payer: Self-pay | Admitting: Emergency Medicine
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
## 2019-06-23 ENCOUNTER — Emergency Department
Admission: EM | Admit: 2019-06-23 | Discharge: 2019-06-23 | Disposition: A | Discharge status: Home / Self Care | Payer: Self-pay | Attending: Emergency Medicine | Admitting: Emergency Medicine

## 2019-06-23 ENCOUNTER — Encounter: Payer: Self-pay | Admitting: *Deleted

## 2019-06-23 ENCOUNTER — Other Ambulatory Visit: Payer: Self-pay

## 2019-06-23 DIAGNOSIS — K0889 Other specified disorders of teeth and supporting structures: Secondary | ICD-10-CM | POA: Insufficient documentation

## 2019-06-23 DIAGNOSIS — Z79899 Other long term (current) drug therapy: Secondary | ICD-10-CM | POA: Insufficient documentation

## 2019-06-23 MED ORDER — NAPROXEN 500 MG PO TABS: 500.0000 mg | ORAL_TABLET | Freq: Two times a day (BID) | ORAL | 0 refills | Status: AC

## 2019-06-23 MED ORDER — AMOXICILLIN 500 MG PO TABS: 500.0000 mg | ORAL_TABLET | Freq: Three times a day (TID) | ORAL | 0 refills | Status: DC

## 2019-06-23 MED ORDER — AMOXICILLIN 500 MG PO CAPS
500.0000 mg | ORAL_CAPSULE | Freq: Once | ORAL | Status: AC
  Administered 2019-06-23: 500 mg via ORAL
  Filled 2019-06-23: qty 1

## 2019-06-23 MED ORDER — TRAMADOL HCL 50 MG PO TABS
50.0000 mg | ORAL_TABLET | Freq: Once | ORAL | Status: AC
  Administered 2019-06-23: 21:00:00 50 mg via ORAL
  Filled 2019-06-23: qty 1

## 2019-06-23 NOTE — ED Triage Notes (Signed)
Pt has left lower toothache.  Sx for 1 month.  Pt using otc meds without relief.  Pt alert.

## 2019-06-23 NOTE — Discharge Instructions (Signed)
Please call and schedule a dental appointment as soon as possible. You will need to be seen within the next 14 days. Return to the emergency department for symptoms that change or worsen if you're unable to schedule an appointment.  OPTIONS FOR DENTAL FOLLOW UP CARE  [place] Department of Health and Human Services - Local Safety Net Dental Clinics http://www.ncdhhs.gov/dph/oralhealth/services/safetynetclinics.htm   [facility] ([phone])  [facility] ([phone])  [facility] ([phone])   [facility] ([phone])  [facility] ([phone]) This clinic caters to the indigent population and is on a lottery system. Location: [facility], [address], [place] Clinic Hours: Wednesdays from 6pm - 9pm, patients seen by a lottery system. For dates, call or go to [email or web address].[facility].edu/[facility]/patients/Dental-[facility] Services: Cleanings, fillings and simple extractions. Payment Options: DENTAL WORK IS FREE OF CHARGE. Bring proof of income or support. Best way to get seen: Arrive at 5:15 pm - this is a lottery, NOT first come/first serve, so arriving earlier will not increase your chances of being seen.     [facility] [phone] Select option 1 for emergencies   Location: [facility], [address], [place] Clinic Hours: No walk-ins accepted - call the day before to schedule an appointment. Check in times are 9:30 am and 1:30 pm. Services: Simple extractions, temporary fillings, pulpectomy/pulp debridement, uncomplicated abscess drainage. Payment Options: PAYMENT IS DUE AT THE TIME OF SERVICE.  Fee is usually $100-200, additional surgical procedures (e.g. abscess drainage) may be extra. Cash, checks, Visa/MasterCard accepted.  Can file Medicaid if patient is covered for dental - patient should call case worker to check. No  discount for [facility] patients. Best way to get seen: MUST call the day before and get onto the schedule. Can usually be seen the next 1-2 days. No walk-ins accepted.     [facility] [phone]   Location: [facility], [address], [place] [facility] Hours: M, W, Th, F 8am or 1:30pm, Tues 9a or 1:30 - first come/first served. Services: Simple extractions, temporary fillings, uncomplicated abscess drainage.  You do not need to be an [place] resident. Payment Options: PAYMENT IS DUE AT THE TIME OF SERVICE. Dental insurance, otherwise sliding scale - bring proof of income or support. Depending on income and treatment needed, cost is usually $50-200. Best way to get seen: Arrive early as it is first come/first served.     [facility] [phone]   Location: [address] Clinic Hours: Mon-Thu 8a-5p Services: Most basic dental services including extractions and fillings. Payment Options: PAYMENT IS DUE AT THE TIME OF SERVICE. Sliding scale, up to 50% off - bring proof if income or support. Medicaid with dental option accepted. Best way to get seen: Call to schedule an appointment, can usually be seen within 2 weeks OR they will try to see walk-ins - show up at 8a or 2p (you may have to wait).     [facility] [phone] [place] RESIDENTS ONLY   Location: [facility], [address], [place],  [address] Clinic Hours: By appointment only. Monday - Thursday 8am-5pm, Friday 8am-12pm Services: Cleanings, fillings, extractions. Payment Options: PAYMENT IS DUE AT THE TIME OF SERVICE. Cash, Visa or MasterCard. Sliding scale - $30 minimum per service. Best way to get seen: Come in to office, complete packet and make an appointment -   need proof of income or support monies for each household member and proof of [place]  residence. Usually takes about a month to get in.     [facility] [phone]   Location: [address] [place] Clinic Hours: Walk-in Urgent Care Dental Services are offered Monday-Friday mornings only. The numbers of emergencies accepted daily is limited to the number of providers available. Maximum 15 - Mondays, Wednesdays & Thursdays Maximum 10 - Tuesdays & Fridays Services: You do not need to be a [place] resident to be seen for a dental emergency. Emergencies are defined as pain, swelling, abnormal bleeding, or dental trauma. Walkins will receive x-rays if needed. NOTE: Dental cleaning is not an emergency. Payment Options: PAYMENT IS DUE AT THE TIME OF SERVICE. Minimum co-pay is $40.00 for uninsured patients. Minimum co-pay is $3.00 for Medicaid with dental coverage. Dental Insurance is accepted and must be presented at time of visit. Medicare does not cover dental. Forms of payment: Cash, credit card, checks. Best way to get seen: If not previously registered with the clinic, walk-in dental registration begins at 7:15 am and is on a first come/first serve basis. If previously registered with the clinic, call to make an appointment.     [facility] [phone] [place] RESIDENTS ONLY   Location: [address], [place], [place] Clinic Hours: Mon-Thu 10a-2p Services: Extractions only! Payment Options: FREE (donations accepted) - bring proof of income or support Best way to get seen: Call and schedule an appointment OR come at 8am on the 1st Monday of every month (except for holidays) when it is first come/first served.     [facility] [phone]   Location: [address], [place] Clinic Hours: Friday mornings Services, Payment Options, Best way to get seen: Call for info  
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
## 2020-04-10 ENCOUNTER — Encounter (HOSPITAL_COMMUNITY): Payer: Self-pay

## 2020-04-10 ENCOUNTER — Other Ambulatory Visit: Payer: Self-pay

## 2020-04-10 ENCOUNTER — Emergency Department: Admission: EM | Admit: 2020-04-10 | Discharge: 2020-04-10 | Discharge status: Against Medical Advice | Payer: MEDICAID

## 2020-04-10 ENCOUNTER — Ambulatory Visit (HOSPITAL_COMMUNITY)
Admission: EM | Admit: 2020-04-10 | Discharge: 2020-04-10 | Disposition: A | Discharge status: Home / Self Care | Payer: Self-pay | Attending: Family Medicine | Admitting: Family Medicine

## 2020-04-10 DIAGNOSIS — K0889 Other specified disorders of teeth and supporting structures: Secondary | ICD-10-CM

## 2020-04-10 MED ORDER — HYDROCODONE-ACETAMINOPHEN 5-325 MG PO TABS: 2.0000 | ORAL_TABLET | ORAL | 0 refills | Status: AC | PRN

## 2020-04-10 MED ORDER — AMOXICILLIN 500 MG PO TABS: 500.0000 mg | ORAL_TABLET | Freq: Three times a day (TID) | ORAL | 0 refills | Status: AC
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
## 2020-04-10 NOTE — ED Triage Notes (Signed)
Pt c/o dental/mouth pain intermittently lower left tooth for approx 1 year. Developed left mandibular swelling yesterday. Denies fever, chills, n/v.

## 2020-04-10 NOTE — Discharge Instructions (Addendum)
[facility] [phone] extension [phone]  Dr[address] Civils [phone] [address]  [facility] [phone] [address]  [facility] [phone] extension [phone] [place], [place], [address] First come first serve for the first 10 clients.  May do simple extractions only, no wisdom teeth or surgery.  You may try the second for Thursday of the month starting at 6:30 AM.  [facility] [employer] [facility] You may call the school to see if they are still helping to provide dental care for emergent cases.
